# Patient Record
Sex: Female | Born: 2000 | Race: White | Hispanic: Yes | Marital: Single | Smoking: Never smoker
Health system: Southern US, Community
[De-identification: ages and names within clinical notes are randomized; demographics above are authoritative.]

## PROBLEM LIST (undated history)

## (undated) DIAGNOSIS — J189 Pneumonia, unspecified organism: Secondary | ICD-10-CM

## (undated) HISTORY — DX: Pneumonia, unspecified organism: J18.9

---
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
## 2019-04-03 IMAGING — DX DG CHEST 2V
2 series · 2 of 2 positions shown · non-contrast
Comparison: None.

CLINICAL DATA: 16-year-old female with cough and fever.

EXAM:
CHEST  2 VIEW

[chest pa]
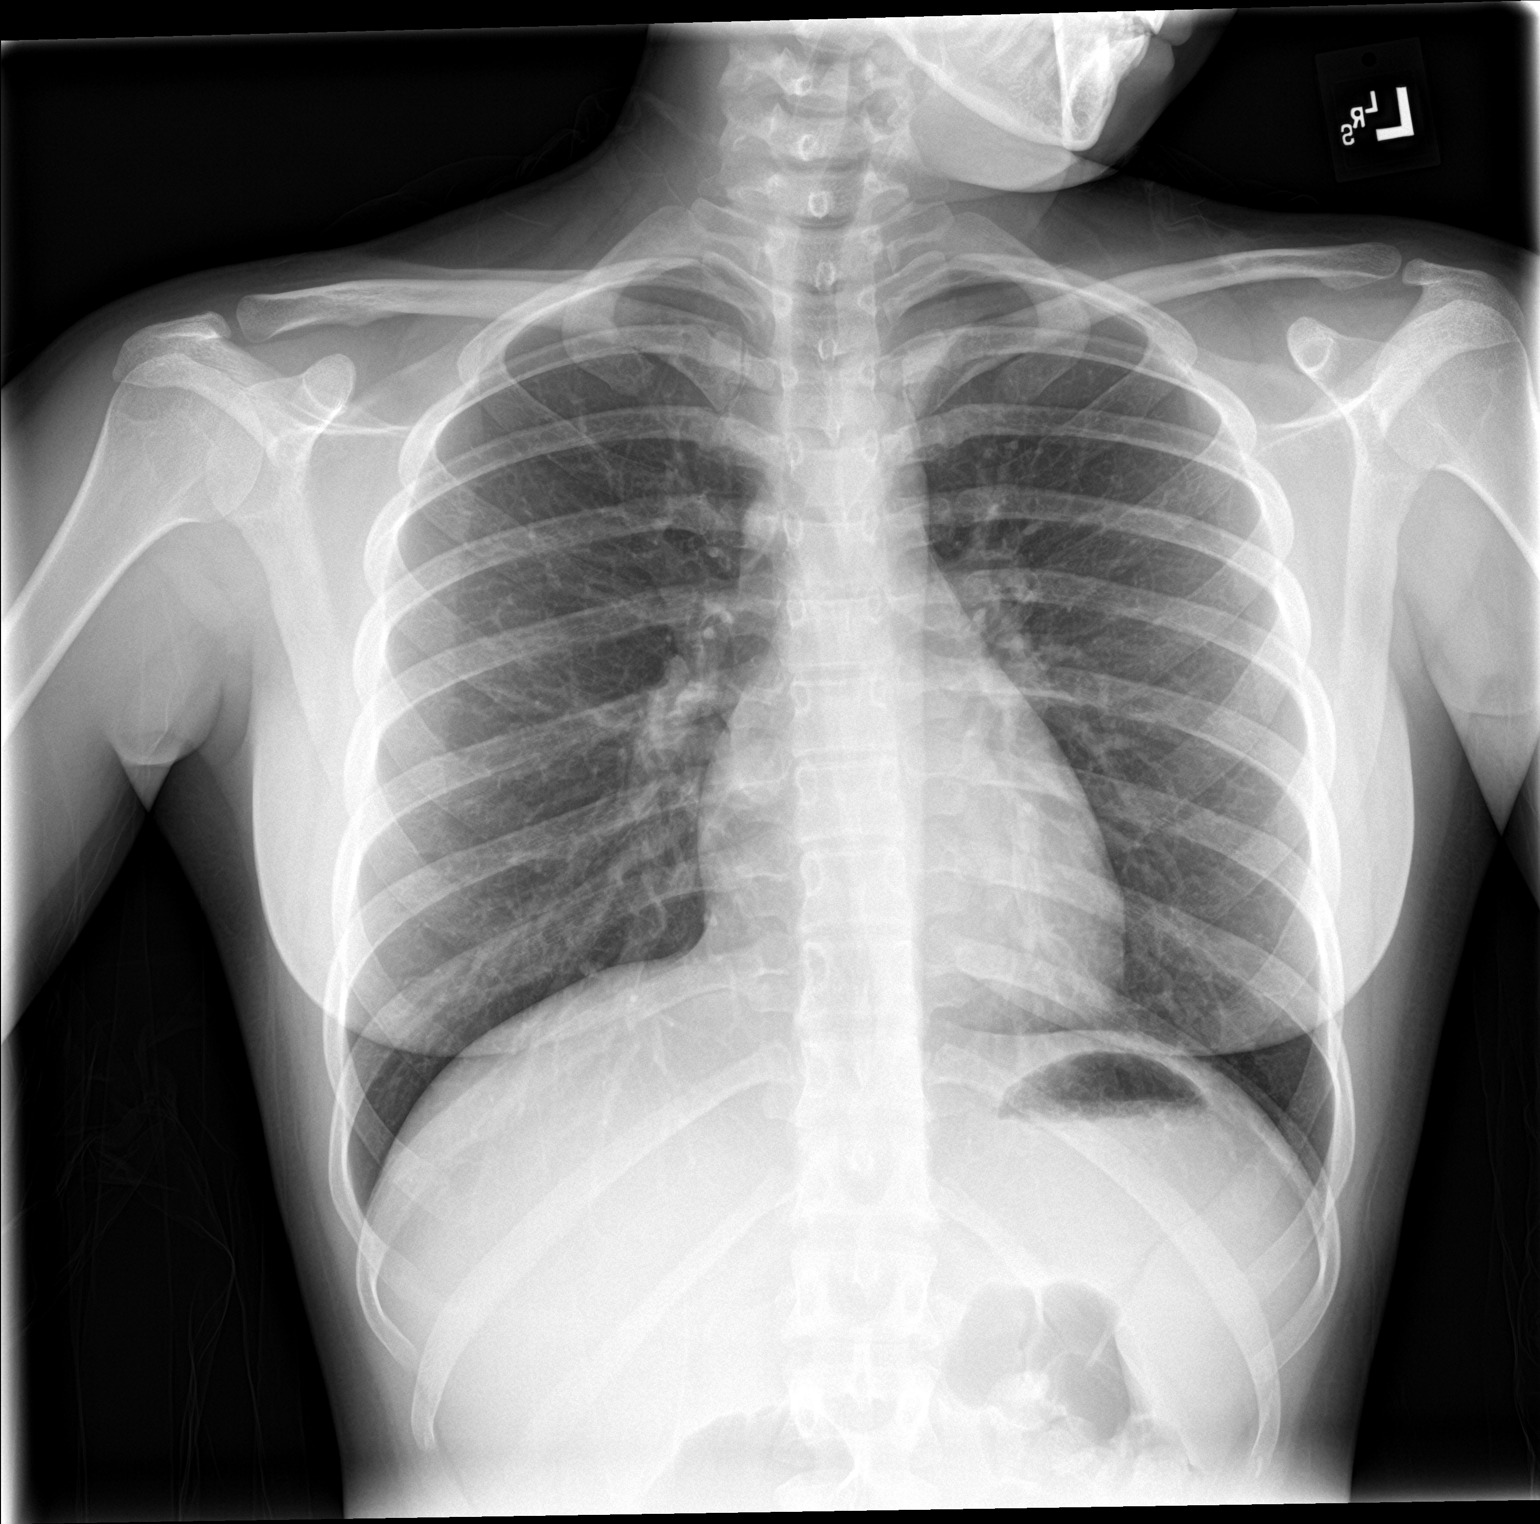

[chest lat]
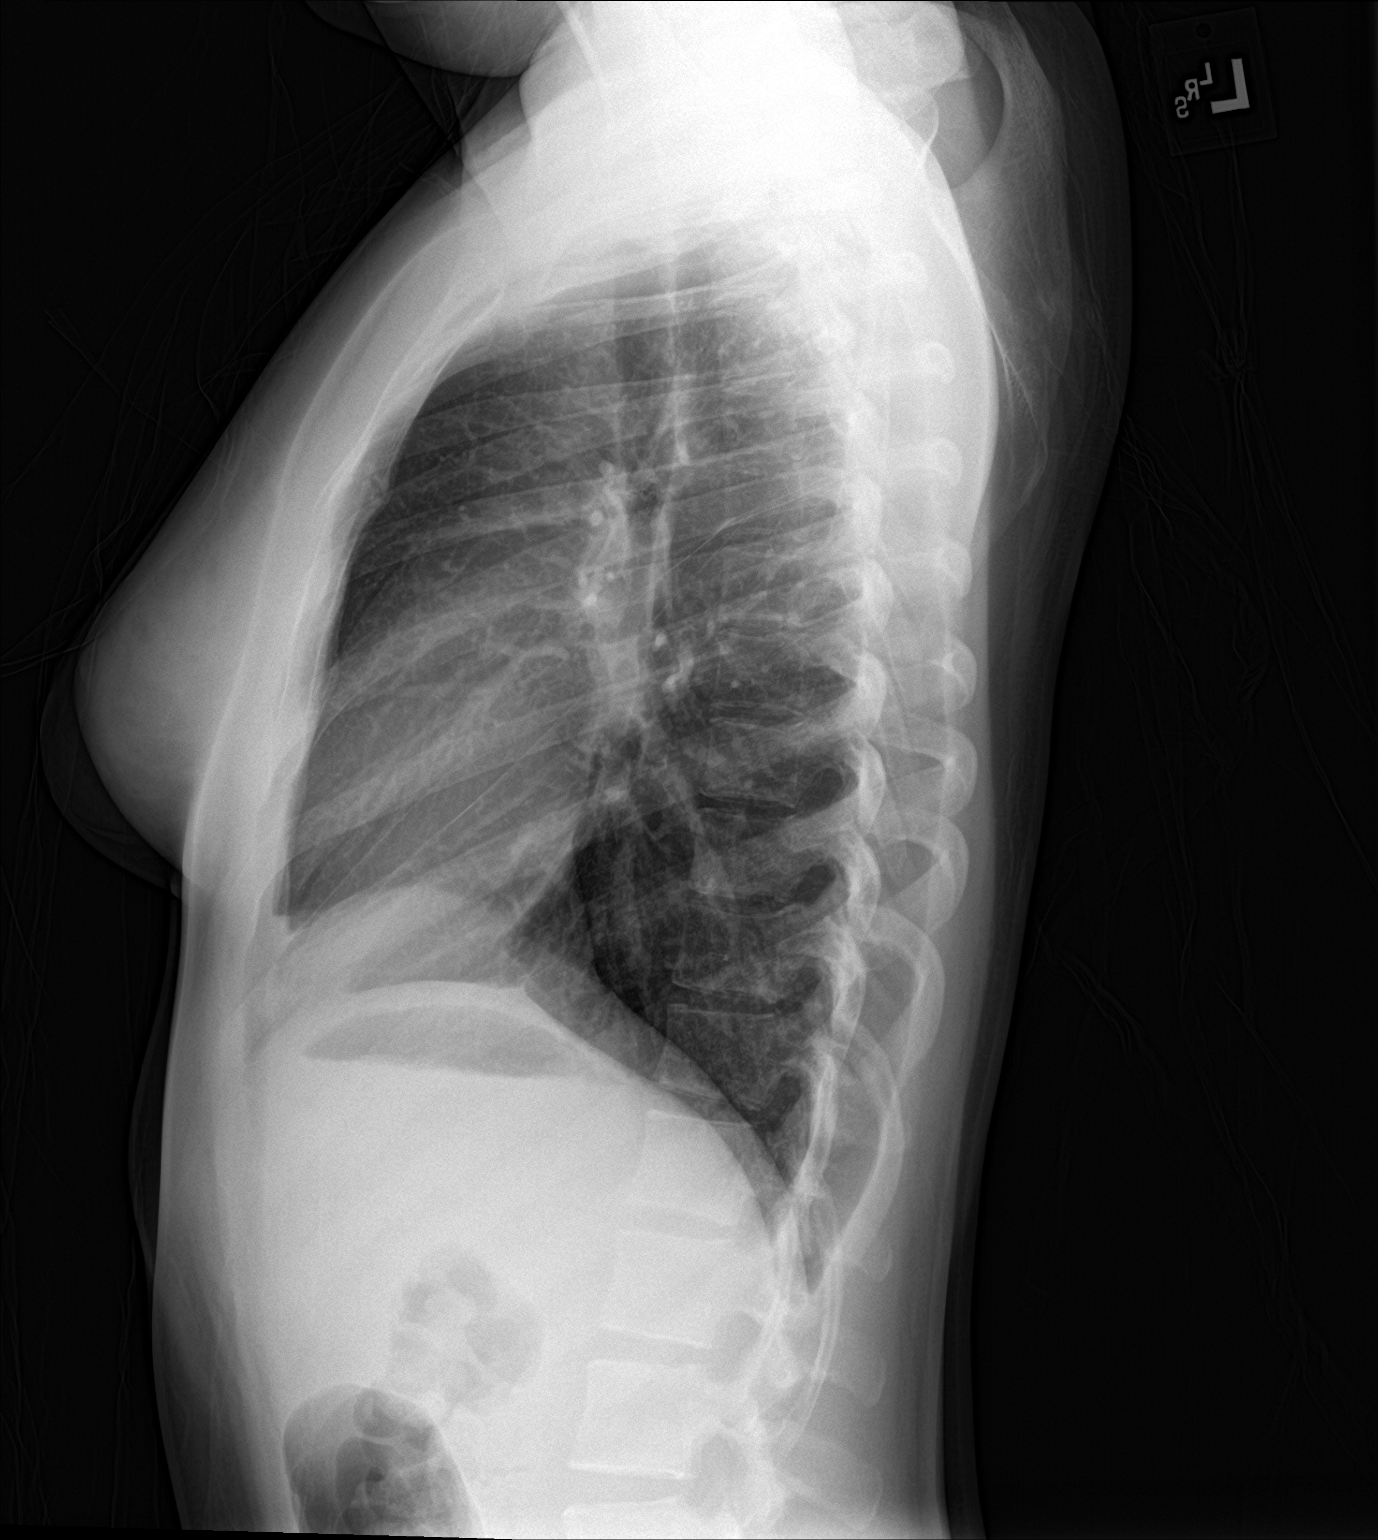

[2 of 2 positions shown; findings below may reference images not displayed]

FINDINGS: The heart size and mediastinal contours are within normal limits.
Both lungs are clear. The visualized skeletal structures are
unremarkable.
IMPRESSION: No active cardiopulmonary disease.
